# Patient Record
Sex: Male | Born: 1985 | Race: White | Hispanic: No | Marital: Single | Smoking: Current every day smoker
Health system: Southern US, Community
[De-identification: ages and names within clinical notes are randomized; demographics above are authoritative.]

## PROBLEM LIST (undated history)

## (undated) DIAGNOSIS — F419 Anxiety disorder, unspecified: Secondary | ICD-10-CM

## (undated) DIAGNOSIS — R030 Elevated blood-pressure reading, without diagnosis of hypertension: Secondary | ICD-10-CM

## (undated) DIAGNOSIS — B360 Pityriasis versicolor: Secondary | ICD-10-CM

## (undated) DIAGNOSIS — F17200 Nicotine dependence, unspecified, uncomplicated: Secondary | ICD-10-CM

## (undated) HISTORY — PX: TONSILLECTOMY: SUR1361

## (undated) HISTORY — DX: Anxiety disorder, unspecified: F41.9

## (undated) HISTORY — DX: Nicotine dependence, unspecified, uncomplicated: F17.200

## (undated) HISTORY — DX: Pityriasis versicolor: B36.0

## (undated) HISTORY — PX: OTHER SURGICAL HISTORY: SHX169

## (undated) HISTORY — DX: Morbid (severe) obesity due to excess calories: E66.01

## (undated) HISTORY — DX: Elevated blood-pressure reading, without diagnosis of hypertension: R03.0

---

## 2004-06-24 ENCOUNTER — Emergency Department (HOSPITAL_COMMUNITY): Admission: EM | Admit: 2004-06-24 | Discharge: 2004-06-24 | Payer: Self-pay | Admitting: Emergency Medicine

## 2004-07-16 ENCOUNTER — Emergency Department (HOSPITAL_COMMUNITY): Admission: EM | Admit: 2004-07-16 | Discharge: 2004-07-16 | Payer: Self-pay | Admitting: Emergency Medicine

## 2004-08-06 ENCOUNTER — Emergency Department (HOSPITAL_COMMUNITY): Admission: EM | Admit: 2004-08-06 | Discharge: 2004-08-07 | Payer: Self-pay | Admitting: *Deleted

## 2004-08-16 ENCOUNTER — Emergency Department (HOSPITAL_COMMUNITY): Admission: EM | Admit: 2004-08-16 | Discharge: 2004-08-16 | Payer: Self-pay | Admitting: Emergency Medicine

## 2004-11-15 ENCOUNTER — Emergency Department (HOSPITAL_COMMUNITY): Admission: EM | Admit: 2004-11-15 | Discharge: 2004-11-15 | Payer: Self-pay | Admitting: Emergency Medicine

## 2004-12-18 ENCOUNTER — Emergency Department (HOSPITAL_COMMUNITY): Admission: EM | Admit: 2004-12-18 | Discharge: 2004-12-19 | Payer: Self-pay | Admitting: Emergency Medicine

## 2005-03-13 ENCOUNTER — Emergency Department (HOSPITAL_COMMUNITY): Admission: EM | Admit: 2005-03-13 | Discharge: 2005-03-13 | Payer: Self-pay | Admitting: Emergency Medicine

## 2005-03-17 ENCOUNTER — Emergency Department (HOSPITAL_COMMUNITY): Admission: EM | Admit: 2005-03-17 | Discharge: 2005-03-17 | Payer: Self-pay | Admitting: Emergency Medicine

## 2009-08-28 ENCOUNTER — Emergency Department (HOSPITAL_COMMUNITY): Admission: EM | Admit: 2009-08-28 | Discharge: 2009-08-28 | Payer: Self-pay | Admitting: Emergency Medicine

## 2010-06-11 ENCOUNTER — Emergency Department (HOSPITAL_COMMUNITY)
Admission: EM | Admit: 2010-06-11 | Discharge: 2010-06-12 | Payer: Self-pay | Source: Home / Self Care | Admitting: Emergency Medicine

## 2010-07-26 ENCOUNTER — Emergency Department (HOSPITAL_COMMUNITY)
Admission: EM | Admit: 2010-07-26 | Discharge: 2010-07-26 | Payer: Self-pay | Source: Home / Self Care | Admitting: Emergency Medicine

## 2010-09-09 LAB — POCT I-STAT, CHEM 8
BUN: 22 mg/dL (ref 6–23)
Calcium, Ion: 1.15 mmol/L (ref 1.12–1.32)
Chloride: 108 mEq/L (ref 96–112)
Creatinine, Ser: 1.1 mg/dL (ref 0.4–1.5)
Glucose, Bld: 102 mg/dL — ABNORMAL HIGH (ref 70–99)
HCT: 45 % (ref 39.0–52.0)
Hemoglobin: 15.3 g/dL (ref 13.0–17.0)
Potassium: 3.7 mEq/L (ref 3.5–5.1)
Sodium: 141 mEq/L (ref 135–145)
TCO2: 25 mmol/L (ref 0–100)

## 2010-09-09 LAB — URINALYSIS, ROUTINE W REFLEX MICROSCOPIC
Glucose, UA: NEGATIVE mg/dL
Hgb urine dipstick: NEGATIVE
Ketones, ur: NEGATIVE mg/dL
Nitrite: NEGATIVE
Protein, ur: NEGATIVE mg/dL
Specific Gravity, Urine: 1.035 — ABNORMAL HIGH (ref 1.005–1.030)
Urobilinogen, UA: 1 mg/dL (ref 0.0–1.0)
pH: 6 (ref 5.0–8.0)

## 2010-09-22 LAB — POCT I-STAT, CHEM 8
BUN: 11 mg/dL (ref 6–23)
Calcium, Ion: 1.11 mmol/L — ABNORMAL LOW (ref 1.12–1.32)
Chloride: 107 mEq/L (ref 96–112)
Creatinine, Ser: 0.8 mg/dL (ref 0.4–1.5)
Glucose, Bld: 108 mg/dL — ABNORMAL HIGH (ref 70–99)
HCT: 43 % (ref 39.0–52.0)
Hemoglobin: 14.6 g/dL (ref 13.0–17.0)
Potassium: 3.9 mEq/L (ref 3.5–5.1)
Sodium: 139 mEq/L (ref 135–145)
TCO2: 24 mmol/L (ref 0–100)

## 2010-09-22 LAB — CBC
HCT: 41.7 % (ref 39.0–52.0)
Hemoglobin: 14.3 g/dL (ref 13.0–17.0)
MCHC: 34.4 g/dL (ref 30.0–36.0)
MCV: 83.2 fL (ref 78.0–100.0)
Platelets: 155 10*3/uL (ref 150–400)
RBC: 5.01 MIL/uL (ref 4.22–5.81)
RDW: 12.3 % (ref 11.5–15.5)
WBC: 6.9 10*3/uL (ref 4.0–10.5)

## 2010-09-22 LAB — DIFFERENTIAL
Basophils Absolute: 0 10*3/uL (ref 0.0–0.1)
Basophils Relative: 0 % (ref 0–1)
Eosinophils Absolute: 0.1 10*3/uL (ref 0.0–0.7)
Eosinophils Relative: 1 % (ref 0–5)
Lymphocytes Relative: 6 % — ABNORMAL LOW (ref 12–46)
Lymphs Abs: 0.4 10*3/uL — ABNORMAL LOW (ref 0.7–4.0)
Monocytes Absolute: 0.6 10*3/uL (ref 0.1–1.0)
Monocytes Relative: 8 % (ref 3–12)
Neutro Abs: 5.8 10*3/uL (ref 1.7–7.7)
Neutrophils Relative %: 85 % — ABNORMAL HIGH (ref 43–77)

## 2010-09-22 LAB — RAPID STREP SCREEN (MED CTR MEBANE ONLY): Streptococcus, Group A Screen (Direct): NEGATIVE

## 2011-03-19 ENCOUNTER — Emergency Department (HOSPITAL_COMMUNITY): Payer: Self-pay

## 2011-03-19 ENCOUNTER — Emergency Department (HOSPITAL_COMMUNITY)
Admission: EM | Admit: 2011-03-19 | Discharge: 2011-03-20 | Disposition: A | Discharge status: Home / Self Care | Payer: Self-pay | Attending: Emergency Medicine | Admitting: Emergency Medicine

## 2011-03-19 DIAGNOSIS — R209 Unspecified disturbances of skin sensation: Secondary | ICD-10-CM | POA: Insufficient documentation

## 2011-03-19 DIAGNOSIS — I10 Essential (primary) hypertension: Secondary | ICD-10-CM | POA: Insufficient documentation

## 2011-03-19 DIAGNOSIS — W298XXA Contact with other powered powered hand tools and household machinery, initial encounter: Secondary | ICD-10-CM | POA: Insufficient documentation

## 2011-03-19 DIAGNOSIS — S61409A Unspecified open wound of unspecified hand, initial encounter: Secondary | ICD-10-CM | POA: Insufficient documentation

## 2011-08-07 ENCOUNTER — Encounter (HOSPITAL_COMMUNITY): Payer: Self-pay | Admitting: *Deleted

## 2011-08-07 ENCOUNTER — Emergency Department (HOSPITAL_COMMUNITY)
Admission: EM | Admit: 2011-08-07 | Discharge: 2011-08-07 | Disposition: A | Discharge status: Home / Self Care | Payer: Self-pay | Attending: Emergency Medicine | Admitting: Emergency Medicine

## 2011-08-07 DIAGNOSIS — B86 Scabies: Secondary | ICD-10-CM | POA: Insufficient documentation

## 2011-08-07 MED ORDER — PERMETHRIN 5 % EX CREA: TOPICAL_CREAM | CUTANEOUS | Status: AC

## 2011-08-07 NOTE — ED Notes (Signed)
Pt in stating he has been exposed to scabies, c/o generalized rash, bumps noted in webbing of fingers, pt states rash x1 month

## 2011-08-08 NOTE — ED Provider Notes (Signed)
History     CSN: [id number]  Arrival date & time 08/07/11  1701   None     Chief Complaint  Patient presents with  . Rash    (Consider location/radiation/quality/duration/timing/severity/associated sxs/prior treatment) HPI  Patient presents to emergency department complaining of a history of scabies exposure as well as a couple day history of a few scattered insect appearing bites on his hands. Patient states one of his family members was diagnosed with scabies and since then states multiple family members have had onset of insect bite marks on body. Patient states the lesions are extremely pruritic. Patient not taking treatment prior to arrival. Patient says he has no known medical problems and takes no medicine on regular basis. Symptoms were gradual onset, and unchanging. Denies aggravating or alleviating factors.  History reviewed. No pertinent past medical history.  History reviewed. No pertinent past surgical history.  History reviewed. No pertinent family history.  History  Substance Use Topics  . Smoking status: Not on file  . Smokeless tobacco: Not on file  . Alcohol Use: Not on file      Review of Systems  All other systems reviewed and are negative.    Allergies  Review of patient's allergies indicates no known allergies.  Home Medications   Current Outpatient Rx  Name Route Sig Dispense Refill  . HYDROCHLOROTHIAZIDE 25 MG PO TABS Oral Take 25 mg by mouth daily.    Marland Kitchen PERMETHRIN 5 % EX CREA  Apply from the neck down and sleep in for 8 hours then wash off in the morning. Reapply the same two weeks later. 60 g 1    BP 141/80  Pulse 80  Temp(Src) 98 F (36.7 C) (Oral)  Resp 20  SpO2 100%  Physical Exam  Vitals reviewed. Constitutional: He is oriented to person, place, and time. He appears well-developed and well-nourished. No distress.  HENT:  Head: Normocephalic and atraumatic.  Mouth/Throat: No oropharyngeal exudate.       Mild erythema of  posterior pharynx and tonsils no tonsillar exudate or enlargement. Patent airway. Swallowing secretions well  Eyes: Conjunctivae are normal.  Neck: Normal range of motion. Neck supple.  Cardiovascular: Normal rate, regular rhythm and normal heart sounds.  Exam reveals no gallop and no friction rub.   No murmur heard. Pulmonary/Chest: Effort normal and breath sounds normal. No respiratory distress. He has no wheezes. He has no rales. He exhibits no tenderness.  Abdominal: Soft. He exhibits no distension. There is no tenderness.  Neurological: He is alert and oriented to person, place, and time. He has normal reflexes.  Skin: Skin is warm and dry. Rash noted. He is not diaphoretic.       A few scattered pinpoint erythematous papules of interdigital spaces of bilateral hands  Psychiatric: He has a normal mood and affect.    ED Course  Procedures (including critical care time)  Labs Reviewed - No data to display No results found.   1. Scabies       MDM  No signs or symptoms of allergic reaction or superimposed infection.         Lenon Oms [place], [place] 08/08/11 [phone]

## 2011-08-08 NOTE — ED Provider Notes (Signed)
Medical screening examination/treatment/procedure(s) were performed by non-physician practitioner and as supervising physician I was immediately available for consultation/collaboration.   Zea Kostka A. Patrica Duel, MD 08/08/11 [phone]

## 2012-05-04 ENCOUNTER — Emergency Department (HOSPITAL_COMMUNITY): Payer: Self-pay

## 2012-05-04 ENCOUNTER — Encounter (HOSPITAL_COMMUNITY): Payer: Self-pay | Admitting: Emergency Medicine

## 2012-05-04 ENCOUNTER — Emergency Department (HOSPITAL_COMMUNITY)
Admission: EM | Admit: 2012-05-04 | Discharge: 2012-05-04 | Disposition: A | Discharge status: Home / Self Care | Payer: Self-pay | Attending: Emergency Medicine | Admitting: Emergency Medicine

## 2012-05-04 DIAGNOSIS — B354 Tinea corporis: Secondary | ICD-10-CM | POA: Insufficient documentation

## 2012-05-04 DIAGNOSIS — R55 Syncope and collapse: Secondary | ICD-10-CM | POA: Insufficient documentation

## 2012-05-04 DIAGNOSIS — F17200 Nicotine dependence, unspecified, uncomplicated: Secondary | ICD-10-CM | POA: Insufficient documentation

## 2012-05-04 LAB — COMPREHENSIVE METABOLIC PANEL
ALT: 17 U/L (ref 0–53)
AST: 18 U/L (ref 0–37)
Albumin: 4.2 g/dL (ref 3.5–5.2)
Alkaline Phosphatase: 64 U/L (ref 39–117)
BUN: 14 mg/dL (ref 6–23)
CO2: 21 mEq/L (ref 19–32)
Calcium: 9.3 mg/dL (ref 8.4–10.5)
Creatinine, Ser: 0.92 mg/dL (ref 0.50–1.35)
GFR calc non Af Amer: >90 mL/min (ref >90)
Glucose, Bld: 78 mg/dL (ref 70–99)
Potassium: 4.3 mEq/L (ref 3.5–5.1)
Sodium: 138 mEq/L (ref 135–145)
Total Protein: 7.5 g/dL (ref 6.0–8.3)

## 2012-05-04 LAB — GLUCOSE, CAPILLARY: Glucose-Capillary: 103 mg/dL — ABNORMAL HIGH (ref 70–99)

## 2012-05-04 LAB — CBC WITH DIFFERENTIAL/PLATELET
Basophils Absolute: 0 10*3/uL (ref 0.0–0.1)
Basophils Relative: 0 % (ref 0–1)
Eosinophils Relative: 2 % (ref 0–5)
HCT: 48 % (ref 39.0–52.0)
Lymphocytes Relative: 24 % (ref 12–46)
Lymphs Abs: 2.6 10*3/uL (ref 0.7–4.0)
MCH: 28.3 pg (ref 26.0–34.0)
MCHC: 34.4 g/dL (ref 30.0–36.0)
MCV: 82.3 fL (ref 78.0–100.0)
Monocytes Relative: 8 % (ref 3–12)
Neutro Abs: 6.9 10*3/uL (ref 1.7–7.7)
Neutrophils Relative %: 65 % (ref 43–77)
Platelets: 284 10*3/uL (ref 150–400)
RDW: 12.4 % (ref 11.5–15.5)
WBC: 10.6 10*3/uL — ABNORMAL HIGH (ref 4.0–10.5)

## 2012-05-04 MED ORDER — FLUCONAZOLE 200 MG PO TABS: 200.0000 mg | ORAL_TABLET | ORAL | Status: AC

## 2012-05-04 NOTE — ED Provider Notes (Signed)
History     CSN: [id number]  Arrival date & time 05/04/12  1851   First MD Initiated Contact with Patient 05/04/12 1937      Chief Complaint  Patient presents with  . Dizziness    HPI  The patient presents after an episode of lightheadedness.  He notes significant anxiety over this episode, which occurred just prior to arrival.  Recalls feeling particularly lightheaded, though without syncope, vomiting, confusion, disorientation, visual changes.  On exam the patient has no ongoing complaints, beyond ongoing skin lesions.  He states over the past years he has had similar episodes of lightheadedness, though also with no syncope.  No prior medical evaluation for these episodes.  He denies any exertional syncope, any chest pain or dyspnea throughout all of these episodes.  History reviewed. No pertinent past medical history.  Past Surgical History  Procedure Date  . Hernia repair   . Tonsillectomy     No family history on file.  History  Substance Use Topics  . Smoking status: Current Every Day Smoker -- 1.0 packs/day  . Smokeless tobacco: Not on file  . Alcohol Use: Yes      Review of Systems  Constitutional:       Per HPI, otherwise negative  HENT:       Per HPI, otherwise negative  Eyes: Negative.   Respiratory:       Per HPI, otherwise negative  Cardiovascular:       Per HPI, otherwise negative  Gastrointestinal: Negative for vomiting.  Genitourinary: Negative.   Musculoskeletal:       Per HPI, otherwise negative  Skin: Positive for rash.  Neurological: Positive for dizziness and light-headedness. Negative for tremors, seizures, syncope, facial asymmetry, speech difficulty, weakness, numbness and headaches.    Allergies  Review of patient's allergies indicates no known allergies.  Home Medications   Current Outpatient Rx  Name  Route  Sig  Dispense  Refill  . IBUPROFEN 200 MG PO TABS   Oral   Take 200 mg by mouth every 6 (six) hours as needed. headache           BP 138/80  Pulse 103  Temp 98.1 F (36.7 C) (Oral)  Resp 23  SpO2 99%  Physical Exam  Nursing note and vitals reviewed. Constitutional: He is oriented to person, place, and time. He appears well-developed. No distress.  HENT:  Head: Normocephalic and atraumatic.  Eyes: Conjunctivae normal and EOM are normal.  Cardiovascular: Normal rate and regular rhythm.   Pulmonary/Chest: Effort normal. No stridor. No respiratory distress.  Abdominal: He exhibits no distension.  Musculoskeletal: He exhibits no edema.       No asym of le, no ttp, no superficial changes  Neurological: He is alert and oriented to person, place, and time.  Skin: Skin is warm and dry. Rash noted.       Diffuse rash about her torso consistent with tinea  Psychiatric: He has a normal mood and affect.    ED Course  Procedures (including critical care time)  Labs Reviewed  GLUCOSE, CAPILLARY - Abnormal; Notable for the following:    Glucose-Capillary 103 (*)     All other components within normal limits  CBC WITH DIFFERENTIAL  COMPREHENSIVE METABOLIC PANEL   Dg Chest 2 View  05/04/2012  *RADIOLOGY REPORT*  Clinical Data: Chest discomfort  CHEST - 2 VIEW  Comparison: 08/28/2009; 06/24/2004  Findings: Grossly unchanged cardiac silhouette and mediastinal contours.  No focal airspace opacities.  No pleural  effusion or pneumothorax.  Unchanged bones.  IMPRESSION: No acute cardiopulmonary disease.   Original Report Authenticated By: Tacey Ruiz, MD      No diagnosis found.   Date: 05/04/2012  Rate: 74  Rhythm: normal sinus rhythm  QRS Axis: normal  Intervals: normal  ST/T Wave abnormalities: normal  Conduction Disutrbances:rsr  Narrative Interpretation:   Old EKG Reviewed: unchanged IVCD is new compared to old. - ABNORMAL  Cardiac: 85sr, normal  O2- 99%ra, normal  Update, the patient requests discharge.  He continues to deny any ongoing complaints    MDM  This young male presents of an  episode of lightheadedness, with no syncope, vomiting, confusion or disorientation.  The patient's description of multiple prior similar events over the past few years is suggestive of vagal mediated, though there is some consideration of dysrhythmia.  The patient's ECG is unremarkable, as are his labs.  There is some consideration of thromboembolic phenomena,, but absent tachycardia, tachypnea, unilateral leg changes, there is low suspicion of DVT/PE.  The patient requested PO meds for his ongoing tinea, which was not responding to topical therapy.  This was accommodated.    Although the patient's w/u was largely reassuring, I stressed the importance of continued evaluation via PMD for complete w/u.     Gerhard Munch, MD 05/04/12 2318

## 2012-05-04 NOTE — ED Notes (Signed)
Pt states that he sometimes gets dizzy.  States that he is "scared shitless" because he was going to sit down earlier today and he got dizzy and tired.  Denies LOC.  All neuro intact.  Pt A&O x4.

## 2013-05-25 IMAGING — CR DG HAND COMPLETE 3+V*L*
3 series · 3 of 3 positions shown · non-contrast
Comparison: None

CLINICAL DATA: Left hand injury and pain.

LEFT HAND - COMPLETE 3+ VIEW

[x hand pa left (1 of 3)]
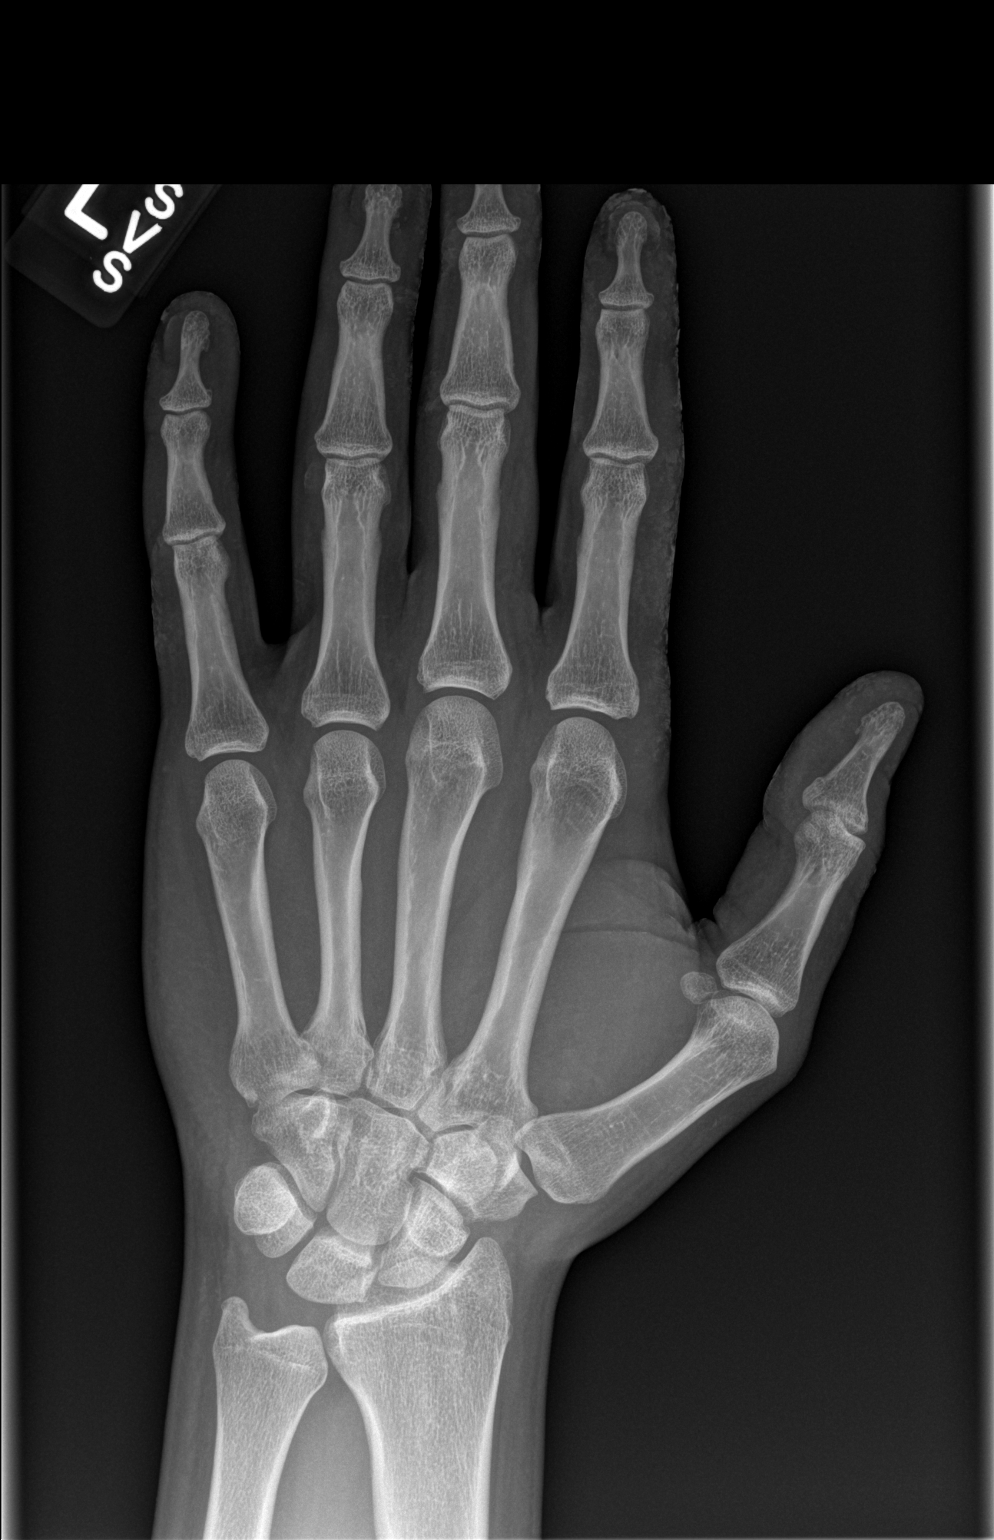

[x hand pa left (2 of 3)]
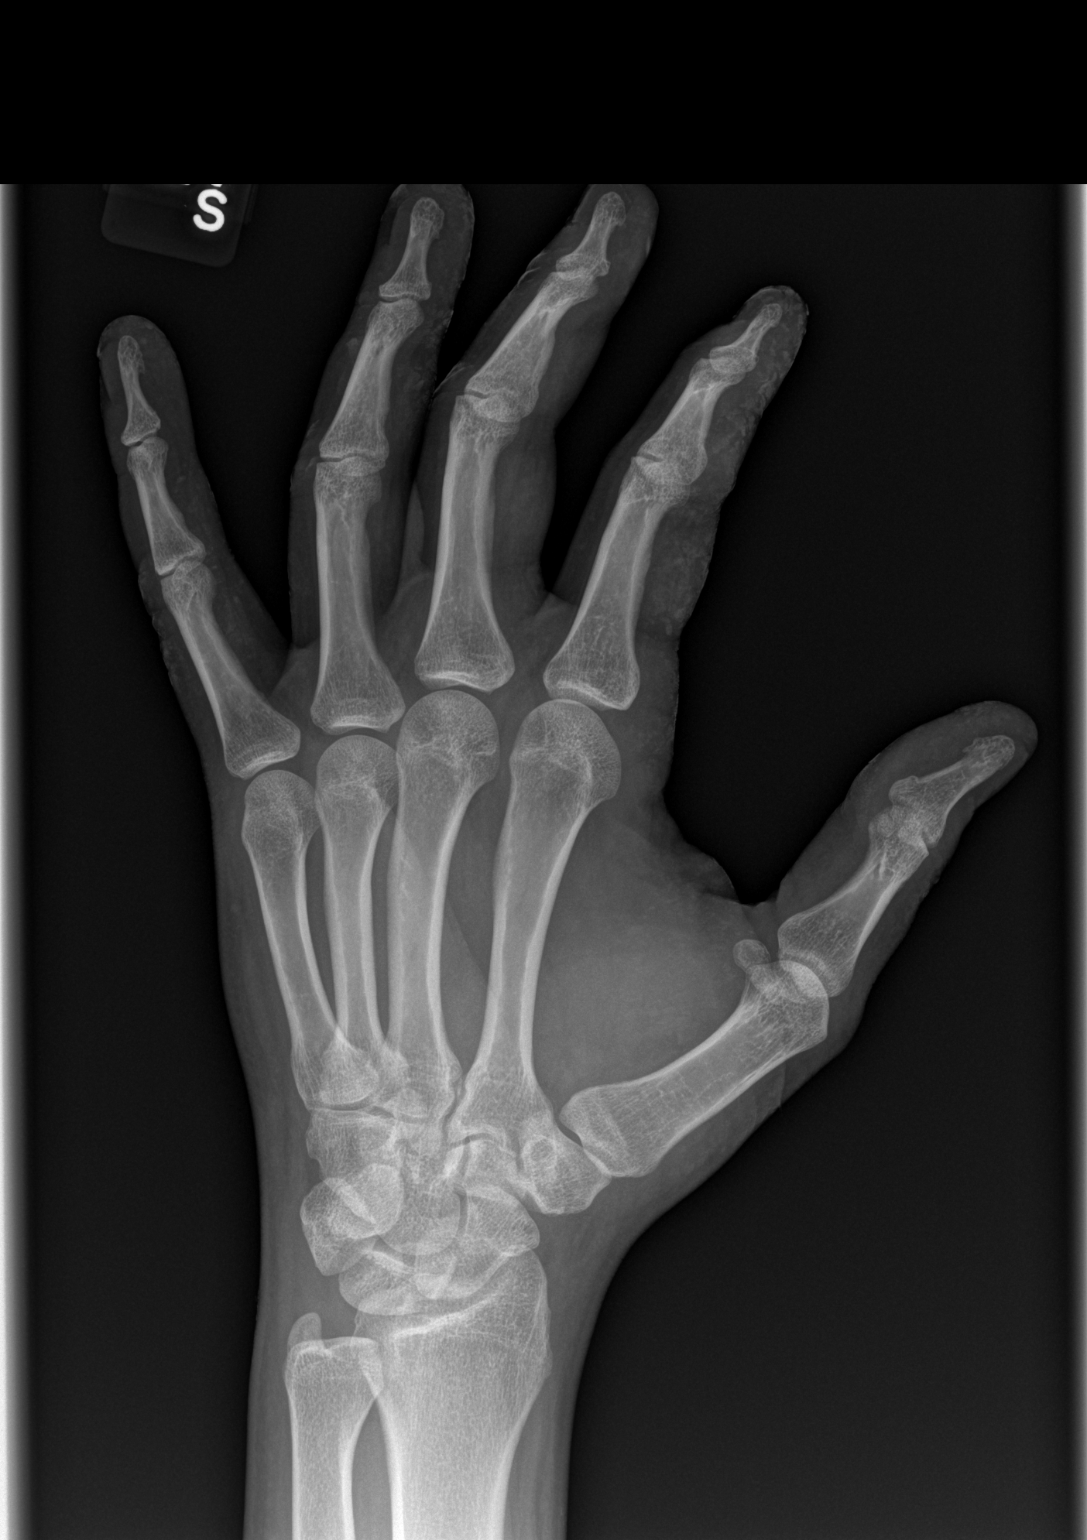

[x hand pa left (3 of 3)]
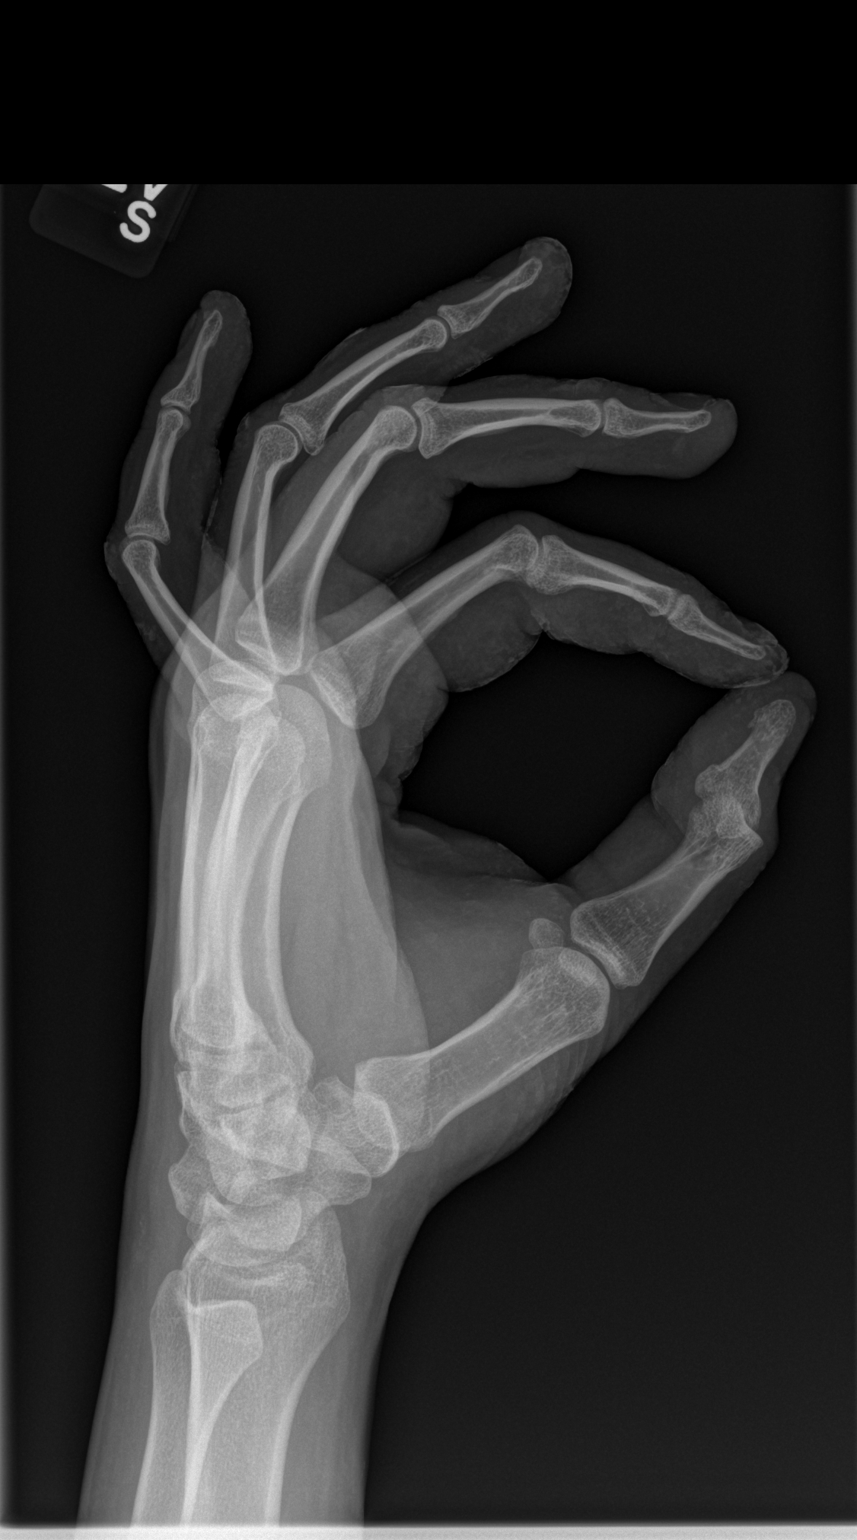

[3 of 3 positions shown; findings below may reference images not displayed]

FINDINGS: No evidence of acute fracture, subluxation or dislocation
identified.

No radio-opaque foreign bodies are present.

No focal bony lesions are noted.

The joint spaces are unremarkable.
IMPRESSION: No acute bony abnormalities.

## 2013-07-02 ENCOUNTER — Encounter (HOSPITAL_COMMUNITY): Payer: Self-pay | Admitting: Emergency Medicine

## 2013-07-02 ENCOUNTER — Emergency Department (HOSPITAL_COMMUNITY)
Admission: EM | Admit: 2013-07-02 | Discharge: 2013-07-02 | Disposition: A | Discharge status: Home / Self Care | Payer: Self-pay | Attending: Emergency Medicine | Admitting: Emergency Medicine

## 2013-07-02 DIAGNOSIS — F17200 Nicotine dependence, unspecified, uncomplicated: Secondary | ICD-10-CM | POA: Insufficient documentation

## 2013-07-02 DIAGNOSIS — Y929 Unspecified place or not applicable: Secondary | ICD-10-CM | POA: Insufficient documentation

## 2013-07-02 DIAGNOSIS — W540XXA Bitten by dog, initial encounter: Secondary | ICD-10-CM | POA: Insufficient documentation

## 2013-07-02 DIAGNOSIS — S61452A Open bite of left hand, initial encounter: Secondary | ICD-10-CM

## 2013-07-02 DIAGNOSIS — Y9389 Activity, other specified: Secondary | ICD-10-CM | POA: Insufficient documentation

## 2013-07-02 DIAGNOSIS — S61409A Unspecified open wound of unspecified hand, initial encounter: Secondary | ICD-10-CM | POA: Insufficient documentation

## 2013-07-02 MED ORDER — IBUPROFEN 800 MG PO TABS
800.0000 mg | ORAL_TABLET | Freq: Once | ORAL | Status: AC
  Administered 2013-07-02: 800 mg via ORAL
  Filled 2013-07-02: qty 1

## 2013-07-02 MED ORDER — IBUPROFEN 800 MG PO TABS: 800.0000 mg | ORAL_TABLET | Freq: Three times a day (TID) | ORAL | Status: DC

## 2013-07-02 MED ORDER — AMOXICILLIN-POT CLAVULANATE 875-125 MG PO TABS
1.0000 | ORAL_TABLET | Freq: Once | ORAL | Status: AC
  Administered 2013-07-02: 1 via ORAL
  Filled 2013-07-02: qty 1

## 2013-07-02 MED ORDER — AMOXICILLIN-POT CLAVULANATE 875-125 MG PO TABS: 1.0000 | ORAL_TABLET | Freq: Two times a day (BID) | ORAL | Status: DC

## 2013-07-02 NOTE — ED Provider Notes (Signed)
CSN: [id number]     Arrival date & time 07/02/13  1507 History  This chart was scribed for non-physician practitioner Elpidio Anis working with Layla Maw Ward, DO by Elveria Rising, ED Scribe. This patient was seen in room WTR8/WTR8 and the patient's care was started at 4:35 PM.   Chief Complaint  Patient presents with  . Animal Bite    The history is provided by the patient. No language interpreter was used.   HPI Comments: Christopher Le is a 28 y.o. male who presents to the Emergency Department complaining of pit bull bite to left hand earlier this afternoon. Patient states that he killed the dog and placed body in dumpster, but says that he is "100% confident the dog did not have rabies."  Patient presents with lacerations and swelling to left hand. Patient says his tetanus vaccination is UTD. Reports no known allergies.   History reviewed. No pertinent past medical history. Past Surgical History  Procedure Laterality Date  . Hernia repair    . Tonsillectomy     History reviewed. No pertinent family history. History  Substance Use Topics  . Smoking status: Current Every Day Smoker -- 1.00 packs/day  . Smokeless tobacco: Not on file  . Alcohol Use: Yes    Review of Systems  Skin: Positive for wound (Dog bite to left hand.).  All other systems reviewed and are negative.    Allergies  Review of patient's allergies indicates no known allergies.  Home Medications  No current outpatient prescriptions on file. Triage Vitals: BP 128/112  Pulse 88  Temp(Src) 98.2 F (36.8 C) (Oral)  Resp 18  SpO2 98% Physical Exam  Nursing note and vitals reviewed. Constitutional: He is oriented to person, place, and time. He appears well-developed and well-nourished. No distress.  HENT:  Head: Normocephalic and atraumatic.  Eyes: EOM are normal.  Neck: Neck supple. No tracheal deviation present.  Pulmonary/Chest: Effort normal. No respiratory distress.  Musculoskeletal:  Left hand has  multiple laceration: One to dorsum overlying 4th MC. Full thickness wound measuring approximately 1.5 cm. Second laceration to interphalangeal space between 4th and 5th digit also full thickness wound measuring approximately 1 cm. Deep abrasion to proximal 4th digit.  No bony deformities. Full ROM.  No tendon deficits.   Neurological: He is alert and oriented to person, place, and time.  Skin: Skin is warm and dry.  Psychiatric: He has a normal mood and affect. His behavior is normal.    ED Course  Procedures (including critical care time) DIAGNOSTIC STUDIES: Oxygen Saturation is 98% on room air, normal by my interpretation.    COORDINATION OF CARE: 4:38 PM- Pt advised of plan for treatment and pt agrees.    Labs Review Labs Reviewed - No data to display Imaging Review No results found.  EKG Interpretation   None       MDM  No diagnosis found. 1. Animal bite, hand  Discussed need for rabies series. The patient declines saying that "I am 100% sure the dog was up-to-date on rabies shots". When asked where the dog that he killed after biting him was he stated it was "in a dumpster". He was informed animal control would be notified for follow up. Discussed the danger of untreated exposure to rabies and that rabies was 100% fatal if contracted and untreated. He declines vaccination. Wounds cleaned, augmentin started.  I personally performed the services described in this documentation, which was scribed in my presence. The recorded information has been reviewed  and is accurate.     Arnoldo HookerShari A Twylah Bennetts, PA-C 07/03/13 1703

## 2013-07-02 NOTE — Discharge Instructions (Signed)
IT IS VERY IMPORTANT THAT YOU TAKE THE ANTIBIOTIC TO PREVENT A LIKELY INFECTION IN THE HAND AFTER THE DOG BITE. RETURN HERE WITH ANY SIGNIFICANT SWELLING, REDNESS, INCREASING PAIN OR DRAINAGE FOR THE BITE WOUNDS.

## 2013-07-02 NOTE — ED Notes (Signed)
Pt states that he knew the dog, belonged to friends and that he was current on shots
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
## 2013-07-13 NOTE — ED Provider Notes (Signed)
Medical screening examination/treatment/procedure(s) were performed by non-physician practitioner and as supervising physician I was immediately available for consultation/collaboration.  EKG Interpretation   None         Jacoba Cherney, MD 07/13/13 0020 

## 2014-01-16 ENCOUNTER — Emergency Department (HOSPITAL_COMMUNITY)
Admission: EM | Admit: 2014-01-16 | Discharge: 2014-01-16 | Disposition: A | Discharge status: Home / Self Care | Payer: Self-pay | Attending: Emergency Medicine | Admitting: Emergency Medicine

## 2014-01-16 ENCOUNTER — Encounter (HOSPITAL_COMMUNITY): Payer: Self-pay | Admitting: Emergency Medicine

## 2014-01-16 DIAGNOSIS — Y929 Unspecified place or not applicable: Secondary | ICD-10-CM | POA: Insufficient documentation

## 2014-01-16 DIAGNOSIS — Y9389 Activity, other specified: Secondary | ICD-10-CM | POA: Insufficient documentation

## 2014-01-16 DIAGNOSIS — X131XXA Other contact with steam and other hot vapors, initial encounter: Secondary | ICD-10-CM

## 2014-01-16 DIAGNOSIS — L02419 Cutaneous abscess of limb, unspecified: Secondary | ICD-10-CM | POA: Insufficient documentation

## 2014-01-16 DIAGNOSIS — F17200 Nicotine dependence, unspecified, uncomplicated: Secondary | ICD-10-CM | POA: Insufficient documentation

## 2014-01-16 DIAGNOSIS — Z79899 Other long term (current) drug therapy: Secondary | ICD-10-CM | POA: Insufficient documentation

## 2014-01-16 DIAGNOSIS — X12XXXA Contact with other hot fluids, initial encounter: Secondary | ICD-10-CM | POA: Insufficient documentation

## 2014-01-16 DIAGNOSIS — T300 Burn of unspecified body region, unspecified degree: Secondary | ICD-10-CM

## 2014-01-16 DIAGNOSIS — L03119 Cellulitis of unspecified part of limb: Principal | ICD-10-CM

## 2014-01-16 DIAGNOSIS — L03116 Cellulitis of left lower limb: Secondary | ICD-10-CM

## 2014-01-16 MED ORDER — IBUPROFEN 800 MG PO TABS
800.0000 mg | ORAL_TABLET | Freq: Once | ORAL | Status: AC
  Administered 2014-01-16: 800 mg via ORAL
  Filled 2014-01-16: qty 1

## 2014-01-16 MED ORDER — SULFAMETHOXAZOLE-TMP DS 800-160 MG PO TABS
1.0000 | ORAL_TABLET | Freq: Once | ORAL | Status: AC
  Administered 2014-01-16: 1 via ORAL
  Filled 2014-01-16: qty 1

## 2014-01-16 MED ORDER — CEPHALEXIN 250 MG PO CAPS
250.0000 mg | ORAL_CAPSULE | Freq: Once | ORAL | Status: AC
  Administered 2014-01-16: 250 mg via ORAL
  Filled 2014-01-16: qty 1

## 2014-01-16 MED ORDER — SULFAMETHOXAZOLE-TRIMETHOPRIM 800-160 MG PO TABS: 1.0000 | ORAL_TABLET | Freq: Two times a day (BID) | ORAL | Status: DC

## 2014-01-16 MED ORDER — OXYCODONE-ACETAMINOPHEN 5-325 MG PO TABS: 1.0000 | ORAL_TABLET | Freq: Four times a day (QID) | ORAL | Status: DC | PRN

## 2014-01-16 MED ORDER — SILVER SULFADIAZINE 1 % EX CREA
TOPICAL_CREAM | Freq: Once | CUTANEOUS | Status: AC
Start: 2014-01-16 — End: 2014-01-16
  Administered 2014-01-16: 03:00:00 via TOPICAL
  Filled 2014-01-16: qty 50

## 2014-01-16 MED ORDER — CEPHALEXIN 500 MG PO CAPS: 500.0000 mg | ORAL_CAPSULE | Freq: Four times a day (QID) | ORAL | Status: DC
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
## 2014-01-19 NOTE — ED Provider Notes (Signed)
Medical screening examination/treatment/procedure(s) were performed by non-physician practitioner and as supervising physician I was immediately available for consultation/collaboration.   Candyce ChurnJohn David Zechariah Bissonnette III, MD 01/19/14 0900

## 2014-10-19 ENCOUNTER — Telehealth: Payer: Self-pay | Admitting: Internal Medicine

## 2014-10-19 NOTE — Telephone Encounter (Signed)
Patient states his father is a patient of Dr. Raphael GibneyJohn's.  He has not been feeling well and would like to establish care with Dr. Alondra RuizJohn.  Please advise.

## 2014-10-24 NOTE — Telephone Encounter (Signed)
Got patient scheduled

## 2014-10-24 NOTE — Telephone Encounter (Signed)
Ok with me 

## 2014-10-26 ENCOUNTER — Encounter: Payer: Self-pay | Admitting: Internal Medicine

## 2014-10-26 ENCOUNTER — Ambulatory Visit (INDEPENDENT_AMBULATORY_CARE_PROVIDER_SITE_OTHER): Payer: Self-pay | Admitting: Internal Medicine

## 2014-10-26 VITALS — BP 122/82 | HR 84 | Temp 98.9°F | Resp 18 | Ht 75.0 in | Wt 299.1 lb

## 2014-10-26 DIAGNOSIS — Z0000 Encounter for general adult medical examination without abnormal findings: Secondary | ICD-10-CM

## 2014-10-26 DIAGNOSIS — Z720 Tobacco use: Secondary | ICD-10-CM

## 2014-10-26 DIAGNOSIS — B360 Pityriasis versicolor: Secondary | ICD-10-CM | POA: Insufficient documentation

## 2014-10-26 DIAGNOSIS — I10 Essential (primary) hypertension: Secondary | ICD-10-CM | POA: Insufficient documentation

## 2014-10-26 DIAGNOSIS — R030 Elevated blood-pressure reading, without diagnosis of hypertension: Secondary | ICD-10-CM

## 2014-10-26 DIAGNOSIS — F17200 Nicotine dependence, unspecified, uncomplicated: Secondary | ICD-10-CM | POA: Insufficient documentation

## 2014-10-26 LAB — GLUCOSE, POCT (MANUAL RESULT ENTRY): POC GLUCOSE: 106 mg/dL — AB (ref 70–99)

## 2014-10-26 MED ORDER — NALTREXONE-BUPROPION HCL ER 8-90 MG PO TB12: ORAL_TABLET | ORAL | Status: AC

## 2014-10-26 MED ORDER — NALTREXONE-BUPROPION HCL ER 8-90 MG PO TB12: ORAL_TABLET | ORAL | Status: DC

## 2014-10-26 NOTE — Assessment & Plan Note (Signed)
Urged to quit 

## 2014-10-26 NOTE — Assessment & Plan Note (Signed)
Ok for [employer],  to f/u any worsening symptoms or concerns

## 2014-10-26 NOTE — Addendum Note (Signed)
Addended by: Maurene CapesPOTTS, Kiylah Loyer M on: 10/26/2014 04:59 PM   Modules accepted: Orders

## 2014-10-26 NOTE — Patient Instructions (Signed)
Please take all new medication as prescribed- the contrave  Please continue all other medications as before, and refills have been done if requested.  Please have the pharmacy call with any other refills you may need.  Please continue your efforts at being more active, low cholesterol diet, and weight control.  Please keep your appointments with your specialists as you may have planned  [facility]

## 2014-10-26 NOTE — Progress Notes (Signed)
   Subjective:    Patient ID: Christopher Le, male    DOB: 10/08/1985, 29 y.o.   MRN: [id number]  HPI  Here to establish, asking for contrave which has worked for him with wt loss in the past, trying to get from his 300 to 220,;  Overall doing ok;  Pt denies Chest pain, worsening SOB, DOE, wheezing, orthopnea, PND, worsening LE edema, palpitations, dizziness or syncope.  Pt denies neurological change such as new headache, facial or extremity weakness.  Pt denies polydipsia, polyuria, or low sugar symptoms. Pt states overall good compliance with treatment and medications, good tolerability, and has been trying to follow appropriate diet.  Pt denies worsening depressive symptoms, suicidal ideation or panic. No fever, night sweats, wt loss, loss of appetite, or other constitutional symptoms. No significant exercise recently.  Still smoking Past Medical History  Diagnosis Date  . Elevated blood pressure reading without diagnosis of hypertension   . Anxiety    Past Surgical History  Procedure Laterality Date  . Bilat inguinal hernia    . Tonsillectomy      reports that he has been smoking.  He does not have any smokeless tobacco history on file. He reports that he drinks alcohol. He reports that he does not use illicit drugs. family history includes Heart attack in his father. No Known Allergies  Review of Systems All otherwise neg per pt     Objective:   Physical Exam BP 122/82 mmHg  Pulse 84  Temp(Src) 98.9 F (37.2 C) (Oral)  Resp 18  Ht 6\' 3"  (1.905 m)  Wt 299 lb 1.3 oz (135.662 kg)  BMI 37.38 kg/m2  SpO2 95% VS noted, morbid obese Constitutional: Pt appears in no significant distress HENT: Head: NCAT.  Right Ear: External ear normal.  Left Ear: External ear normal.  Eyes: . Pupils are equal, round, and reactive to light. Conjunctivae and EOM are normal Neck: Normal range of motion. Neck supple.  Cardiovascular: Normal rate and regular rhythm.   Pulmonary/Chest: Effort normal  and breath sounds without rales or wheezing.  Abd:  Soft, NT, ND, + BS Neurological: Pt is alert. Not confused , motor grossly intact Skin: Skin is warm. No rash, no LE edema Psychiatric: Pt behavior is normal. No agitation. 2+ nervous    Assessment & Plan:

## 2014-10-26 NOTE — Assessment & Plan Note (Signed)
stable overall by history and exam, recent data reviewed with pt, and pt to continue medical treatment as before,  to f/u any worsening symptoms or concerns BP Readings from Last 3 Encounters:  10/26/14 122/82  01/16/14 132/70  07/02/13 128/112

## 2014-12-09 ENCOUNTER — Emergency Department (HOSPITAL_BASED_OUTPATIENT_CLINIC_OR_DEPARTMENT_OTHER)
Admission: EM | Admit: 2014-12-09 | Discharge: 2014-12-09 | Disposition: A | Discharge status: Home / Self Care | Payer: Self-pay | Attending: Emergency Medicine | Admitting: Emergency Medicine

## 2014-12-09 ENCOUNTER — Encounter (HOSPITAL_BASED_OUTPATIENT_CLINIC_OR_DEPARTMENT_OTHER): Payer: Self-pay

## 2014-12-09 DIAGNOSIS — Z79899 Other long term (current) drug therapy: Secondary | ICD-10-CM | POA: Insufficient documentation

## 2014-12-09 DIAGNOSIS — Z792 Long term (current) use of antibiotics: Secondary | ICD-10-CM | POA: Insufficient documentation

## 2014-12-09 DIAGNOSIS — Z8619 Personal history of other infectious and parasitic diseases: Secondary | ICD-10-CM | POA: Insufficient documentation

## 2014-12-09 DIAGNOSIS — L02511 Cutaneous abscess of right hand: Secondary | ICD-10-CM | POA: Insufficient documentation

## 2014-12-09 DIAGNOSIS — Z8659 Personal history of other mental and behavioral disorders: Secondary | ICD-10-CM | POA: Insufficient documentation

## 2014-12-09 DIAGNOSIS — Z720 Tobacco use: Secondary | ICD-10-CM | POA: Insufficient documentation

## 2014-12-09 MED ORDER — AMOXICILLIN-POT CLAVULANATE 875-125 MG PO TABS
ORAL_TABLET | ORAL | Status: AC
  Filled 2014-12-09: qty 1

## 2014-12-09 MED ORDER — HYDROCODONE-ACETAMINOPHEN 5-325 MG PO TABS: 1.0000 | ORAL_TABLET | Freq: Once | ORAL | Status: DC

## 2014-12-09 MED ORDER — AMOXICILLIN-POT CLAVULANATE 875-125 MG PO TABS: 1.0000 | ORAL_TABLET | Freq: Two times a day (BID) | ORAL | Status: DC

## 2014-12-09 MED ORDER — AMOXICILLIN-POT CLAVULANATE 875-125 MG PO TABS
1.0000 | ORAL_TABLET | Freq: Once | ORAL | Status: AC
  Administered 2014-12-09: 1 via ORAL

## 2014-12-09 MED ORDER — HYDROCODONE-ACETAMINOPHEN 5-325 MG PO TABS
ORAL_TABLET | ORAL | Status: AC
  Filled 2014-12-09: qty 1

## 2014-12-09 MED ORDER — LIDOCAINE HCL (PF) 1 % IJ SOLN
INTRAMUSCULAR | Status: AC
  Administered 2014-12-09: 02:00:00
  Filled 2014-12-09: qty 5
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
## 2014-12-09 NOTE — Discharge Instructions (Signed)
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
## 2016-01-16 ENCOUNTER — Emergency Department (HOSPITAL_COMMUNITY)
Admission: EM | Admit: 2016-01-16 | Discharge: 2016-01-16 | Disposition: A | Discharge status: Home / Self Care | Payer: Self-pay | Attending: Emergency Medicine | Admitting: Emergency Medicine

## 2016-01-16 ENCOUNTER — Encounter (HOSPITAL_COMMUNITY): Payer: Self-pay | Admitting: Family Medicine

## 2016-01-16 ENCOUNTER — Emergency Department (HOSPITAL_COMMUNITY): Payer: Self-pay

## 2016-01-16 DIAGNOSIS — R0789 Other chest pain: Secondary | ICD-10-CM | POA: Insufficient documentation

## 2016-01-16 DIAGNOSIS — Z720 Tobacco use: Secondary | ICD-10-CM

## 2016-01-16 DIAGNOSIS — I10 Essential (primary) hypertension: Secondary | ICD-10-CM | POA: Insufficient documentation

## 2016-01-16 DIAGNOSIS — F17200 Nicotine dependence, unspecified, uncomplicated: Secondary | ICD-10-CM | POA: Insufficient documentation

## 2016-01-16 LAB — CBC
HCT: 46.9 % (ref 39.0–52.0)
HEMOGLOBIN: 15.8 g/dL (ref 13.0–17.0)
MCH: 28.2 pg (ref 26.0–34.0)
MCHC: 33.7 g/dL (ref 30.0–36.0)
MCV: 83.6 fL (ref 78.0–100.0)
Platelets: 229 10*3/uL (ref 150–400)
RBC: 5.61 MIL/uL (ref 4.22–5.81)
RDW: 12.3 % (ref 11.5–15.5)
WBC: 9.3 10*3/uL (ref 4.0–10.5)

## 2016-01-16 LAB — URINALYSIS, ROUTINE W REFLEX MICROSCOPIC
Bilirubin Urine: NEGATIVE
GLUCOSE, UA: NEGATIVE mg/dL
Hgb urine dipstick: NEGATIVE
KETONES UR: NEGATIVE mg/dL
LEUKOCYTES UA: NEGATIVE
Nitrite: NEGATIVE
PROTEIN: NEGATIVE mg/dL
Specific Gravity, Urine: 1.023 (ref 1.005–1.030)
pH: 6 (ref 5.0–8.0)

## 2016-01-16 LAB — RAPID URINE DRUG SCREEN, HOSP PERFORMED
AMPHETAMINES: NOT DETECTED
Barbiturates: NOT DETECTED
Benzodiazepines: NOT DETECTED
Cocaine: NOT DETECTED
OPIATES: NOT DETECTED
Tetrahydrocannabinol: NOT DETECTED

## 2016-01-16 LAB — BASIC METABOLIC PANEL
Anion gap: 7 (ref 5–15)
BUN: 11 mg/dL (ref 6–20)
CHLORIDE: 109 mmol/L (ref 101–111)
CO2: 22 mmol/L (ref 22–32)
Calcium: 9.1 mg/dL (ref 8.9–10.3)
Creatinine, Ser: 1.01 mg/dL (ref 0.61–1.24)
GFR calc Af Amer: >60 mL/min (ref >60)
GLUCOSE: 107 mg/dL — AB (ref 65–99)
Potassium: 4 mmol/L (ref 3.5–5.1)
Sodium: 138 mmol/L (ref 135–145)

## 2016-01-16 LAB — I-STAT TROPONIN, ED: Troponin i, poc: 0 ng/mL (ref 0.00–0.08)

## 2016-01-16 MED ORDER — METOPROLOL TARTRATE 5 MG/5ML IV SOLN
5.0000 mg | Freq: Once | INTRAVENOUS | Status: AC
  Administered 2016-01-16: 5 mg via INTRAVENOUS
  Filled 2016-01-16: qty 5

## 2016-01-16 MED ORDER — METOPROLOL TARTRATE 25 MG PO TABS: 25.0000 mg | ORAL_TABLET | Freq: Every day | ORAL | Status: DC

## 2016-01-16 NOTE — ED Notes (Addendum)
Patient here with 1 week of left arm pain and today noted that his BP was high, no history of hypertension. Complains of some shortness of breath with same and weakness. NAD. denies CP. Patient took 2 of his fathers BP meds this am, doesn't know which BP med it was

## 2016-01-16 NOTE — ED Provider Notes (Signed)
CSN: [id number]     Arrival date & time 01/16/16  1219 History   First MD Initiated Contact with Patient 01/16/16 1350     Chief Complaint  Patient presents with  . left arm pain   . Hypertension  Pt is a 30 yo wm with a hx of HTN who comes in today with elevated bp.  The pt said that he was taken off his bp meds a few years ago.  Pt said that his bp has been elevated for the past few weeks.  He took 1 of his dad's amiodarone and enalipril.  Pt does not know what doses.  Pt also said that he's had CP and left arm for the past week.  Pt wants to know what his coronary arteries look like.  (Consider location/radiation/quality/duration/timing/severity/associated sxs/prior Treatment) The history is provided by the patient.    Past Medical History  Diagnosis Date  . Elevated blood pressure reading without diagnosis of hypertension   . Anxiety   . Tinea versicolor   . Smoker   . Morbid obesity [facility](HCC)    Past Surgical History  Procedure Laterality Date  . Bilat inguinal hernia    . Tonsillectomy     Family History  Problem Relation Age of Onset  . Heart attack Father    Social History  Substance Use Topics  . Smoking status: Current Every Day Smoker -- 1.00 packs/day  . Smokeless tobacco: None  . Alcohol Use: 0.0 oz/week    0 Standard drinks or equivalent per week     Comment: once a week    Review of Systems  Cardiovascular: Positive for chest pain.  All other systems reviewed and are negative.     Allergies  Review of patient's allergies indicates no known allergies.  Home Medications   Prior to Admission medications   Medication Sig Start Date End Date Taking? Authorizing Provider  ibuprofen (ADVIL,MOTRIN) 200 MG tablet Take 800 mg by mouth every 6 (six) hours as needed (pain).   Yes Historical Provider, MD  amoxicillin-clavulanate (AUGMENTIN) 875-125 MG per tablet Take 1 tablet by mouth 2 (two) times daily. One po bid x 7 days Patient not taking: Reported on  01/16/2016 12/09/14   Paula LibraJohn Molpus, MD  metoprolol (LOPRESSOR) 25 MG tablet Take 1 tablet (25 mg total) by mouth daily. 01/16/16   Jacalyn LefevreJulie Anshi Jalloh, MD  Naltrexone-Bupropion HCl ER 8-90 MG TB12 2 tab by mouth twice per day starting Month #2 Patient not taking: Reported on 01/16/2016 10/26/14   Corwin LevinsJames W John, MD   BP 132/92 mmHg  Pulse 74  Temp(Src) 98.1 F (36.7 C) (Oral)  Resp 22  Ht 6\' 5"  (1.956 m)  Wt 290 lb (131.543 kg)  BMI 34.38 kg/m2  SpO2 95% Physical Exam  Constitutional: He is oriented to person, place, and time. He appears well-developed and well-nourished.  HENT:  Head: Normocephalic and atraumatic.  Right Ear: External ear normal.  Left Ear: External ear normal.  Nose: Nose normal.  Mouth/Throat: Oropharynx is clear and moist.  Eyes: Conjunctivae and EOM are normal. Pupils are equal, round, and reactive to light.  Neck: Normal range of motion. Neck supple.  Cardiovascular: Normal rate, regular rhythm and normal heart sounds.   Pulmonary/Chest: Effort normal and breath sounds normal.  Abdominal: Soft. Bowel sounds are normal.  Musculoskeletal: Normal range of motion.  Neurological: He is alert and oriented to person, place, and time.  Skin: Skin is warm and dry.  Psychiatric: He has a normal mood  and affect. His behavior is normal. Judgment and thought content normal.  Nursing note and vitals reviewed.   ED Course  Procedures (including critical care time) Labs Review Labs Reviewed  BASIC METABOLIC PANEL - Abnormal; Notable for the following:    Glucose, Bld 107 (*)    All other components within normal limits  CBC  URINALYSIS, ROUTINE W REFLEX MICROSCOPIC (NOT AT [facility])  URINE RAPID DRUG SCREEN, HOSP PERFORMED  I-STAT TROPOININ, ED    Imaging Review Dg Chest 2 View  01/16/2016  CLINICAL DATA:  Chest pain with shortness breath today. History of hypertension and smoking. EXAM: CHEST  2 VIEW COMPARISON:  05/04/2012 and 08/28/2009. FINDINGS: The heart size and  mediastinal contours are normal. The lungs are clear. There is no pleural effusion or pneumothorax. No acute osseous findings are identified. IMPRESSION: Stable chest.  No active cardiopulmonary process . Electronically Signed   By: Carey Bullocks M.D.   On: 01/16/2016 12:56   I have personally reviewed and evaluated these images and lab results as part of my medical decision-making.   EKG Interpretation   Date/Time:  Wednesday January 16 2016 12:26:29 EDT Ventricular Rate:  91 PR Interval:  166 QRS Duration: 104 QT Interval:  346 QTC Calculation: 425 R Axis:   93 Text Interpretation:  Normal sinus rhythm Rightward axis Borderline ECG  Confirmed by Aja Bolander MD, Manasvini Whatley ([id number]) on 01/16/2016 2:03:10 PM      MDM  I spoke with pt about trying to stop smoking and living a healthier life style.  He is encouraged to eat better and exercise.  He is given the number to cardiology.  He knows to return if worse.  Final diagnoses:  Essential hypertension  Tobacco abuse  Atypical chest pain      Jacalyn Lefevre, MD 01/16/16 1538

## 2016-01-16 NOTE — Discharge Instructions (Signed)
Hypertension Hypertension, commonly called high blood pressure, is when the force of blood pumping through your arteries is too strong. Your arteries are the blood vessels that carry blood from your heart throughout your body. A blood pressure reading consists of a higher number over a lower number, such as 110/72. The higher number (systolic) is the pressure inside your arteries when your heart pumps. The lower number (diastolic) is the pressure inside your arteries when your heart relaxes. Ideally you want your blood pressure below 120/80. Hypertension forces your heart to work harder to pump blood. Your arteries may become narrow or stiff. Having untreated or uncontrolled hypertension can cause heart attack, stroke, kidney disease, and other problems. RISK FACTORS Some risk factors for high blood pressure are controllable. Others are not.  Risk factors you cannot control include:   Race. You may be at higher risk if you are African American.  Age. Risk increases with age.  Gender. Men are at higher risk than women before age 45 years. After age 65, women are at higher risk than men. Risk factors you can control include:  Not getting enough exercise or physical activity.  Being overweight.  Getting too much fat, sugar, calories, or salt in your diet.  Drinking too much alcohol. SIGNS AND SYMPTOMS Hypertension does not usually cause signs or symptoms. Extremely high blood pressure (hypertensive crisis) may cause headache, anxiety, shortness of breath, and nosebleed. DIAGNOSIS To check if you have hypertension, your health care provider will measure your blood pressure while you are seated, with your arm held at the level of your heart. It should be measured at least twice using the same arm. Certain conditions can cause a difference in blood pressure between your right and left arms. A blood pressure reading that is higher than normal on one occasion does not mean that you need treatment. If  it is not clear whether you have high blood pressure, you may be asked to return on a different day to have your blood pressure checked again. Or, you may be asked to monitor your blood pressure at home for 1 or more weeks. TREATMENT Treating high blood pressure includes making lifestyle changes and possibly taking medicine. Living a healthy lifestyle can help lower high blood pressure. You may need to change some of your habits. Lifestyle changes may include:  Following the DASH diet. This diet is high in fruits, vegetables, and whole grains. It is low in salt, red meat, and added sugars.  Keep your sodium intake below 2,300 mg per day.  Getting at least 30-45 minutes of aerobic exercise at least 4 times per week.  Losing weight if necessary.  Not smoking.  Limiting alcoholic beverages.  Learning ways to reduce stress. Your health care provider may prescribe medicine if lifestyle changes are not enough to get your blood pressure under control, and if one of the following is true:  You are 18-59 years of age and your systolic blood pressure is above 140.  You are 60 years of age or older, and your systolic blood pressure is above 150.  Your diastolic blood pressure is above 90.  You have diabetes, and your systolic blood pressure is over 140 or your diastolic blood pressure is over 90.  You have kidney disease and your blood pressure is above 140/90.  You have heart disease and your blood pressure is above 140/90. Your personal target blood pressure may vary depending on your medical conditions, your age, and other factors. HOME CARE INSTRUCTIONS    Have your blood pressure rechecked as directed by your health care provider.   Take medicines only as directed by your health care provider. Follow the directions carefully. Blood pressure medicines must be taken as prescribed. The medicine does not work as well when you skip doses. Skipping doses also puts you at risk for  problems.  Do not smoke.   Monitor your blood pressure at home as directed by your health care provider. SEEK MEDICAL CARE IF:   You think you are having a reaction to medicines taken.  You have recurrent headaches or feel dizzy.  You have swelling in your ankles.  You have trouble with your vision. SEEK IMMEDIATE MEDICAL CARE IF:  You develop a severe headache or confusion.  You have unusual weakness, numbness, or feel faint.  You have severe chest or abdominal pain.  You vomit repeatedly.  You have trouble breathing. MAKE SURE YOU:   Understand these instructions.  Will watch your condition.  Will get help right away if you are not doing well or get worse.   This information is not intended to replace advice given to you by your health care provider. Make sure you discuss any questions you have with your health care provider.   Document Released: 06/16/2005 Document Revised: 10/31/2014 Document Reviewed: 04/08/2013 Elsevier Interactive Patient Education 2016 [employer]  

## 2016-02-14 ENCOUNTER — Ambulatory Visit (INDEPENDENT_AMBULATORY_CARE_PROVIDER_SITE_OTHER): Payer: Self-pay | Admitting: Internal Medicine

## 2016-02-14 ENCOUNTER — Encounter: Payer: Self-pay | Admitting: Internal Medicine

## 2016-02-14 VITALS — BP 122/80 | HR 75 | Temp 97.9°F | Resp 20 | Wt 290.0 lb

## 2016-02-14 DIAGNOSIS — I10 Essential (primary) hypertension: Secondary | ICD-10-CM

## 2016-02-14 DIAGNOSIS — F411 Generalized anxiety disorder: Secondary | ICD-10-CM

## 2016-02-14 MED ORDER — METOPROLOL SUCCINATE ER 25 MG PO TB24: 25.0000 mg | ORAL_TABLET | Freq: Every day | ORAL | 3 refills | Status: AC

## 2016-02-14 MED ORDER — CLONAZEPAM 0.5 MG PO TABS: ORAL_TABLET | ORAL | 1 refills | Status: AC

## 2016-02-14 NOTE — Progress Notes (Signed)
   Subjective:    Patient ID: Christopher Le, male    DOB: 10/02/1985, 30 y.o.   MRN: [id number]  HPI  Here to f/u; overall doing ok,  Pt denies chest pain, increasing sob or doe, wheezing, orthopnea, PND, increased LE swelling, palpitations, dizziness or syncope.  Pt denies new neurological symptoms such as new headache, or facial or extremity weakness or numbness.  Pt denies polydipsia, polyuria, or low sugar episode.   Pt denies new neurological symptoms such as new headache, or facial or extremity weakness or numbness.   Pt states overall good compliance with meds, mostly trying to follow appropriate diet, with wt overall stable,  but little exercise however. Denies worsening depressive symptoms, suicidal ideation, or panic; has ongoing anxiety BP Readings from Last 3 Encounters:  02/14/16 122/80  01/16/16 128/91  12/09/14 141/85   Past Medical History:  Diagnosis Date  . Anxiety   . Elevated blood pressure reading without diagnosis of hypertension   . Morbid obesity (HCC)   . Smoker   . Tinea versicolor    Past Surgical History:  Procedure Laterality Date  . bilat inguinal hernia    . TONSILLECTOMY      reports that he has been smoking.  He has been smoking about 1.00 pack per day. He does not have any smokeless tobacco history on file. He reports that he drinks alcohol. He reports that he does not use drugs. family history includes Heart attack in his father. No Known Allergies Current Outpatient Prescriptions on File Prior to Visit  Medication Sig Dispense Refill  . ibuprofen (ADVIL,MOTRIN) 200 MG tablet Take 800 mg by mouth every 6 (six) hours as needed (pain).    . Naltrexone-Bupropion HCl ER 8-90 MG TB12 2 tab by mouth twice per day starting Month #2 60 tablet 5   No current facility-administered medications on file prior to visit.    Review of Systems All otherwise neg per pt     Objective:   Physical Exam BP 122/80   Pulse 75   Temp 97.9 F (36.6 C) (Oral)   Resp  20   Wt 290 lb (131.5 kg)   SpO2 92%   BMI 34.39 kg/m  VS noted,  Constitutional: Pt appears in no apparent distress HENT: Head: NCAT.  Right Ear: External ear normal.  Left Ear: External ear normal.  Eyes: . Pupils are equal, round, and reactive to light. Conjunctivae and EOM are normal Neck: Normal range of motion. Neck supple.  Cardiovascular: Normal rate and regular rhythm.   Pulmonary/Chest: Effort normal and breath sounds without rales or wheezing.  Abd:  Soft, NT, ND, + BS Neurological: Pt is alert. Not confused , motor grossly intact Skin: Skin is warm. No rash, no LE edema Psychiatric: Pt behavior is normal. No agitation. mild nervous    Assessment & Plan:

## 2016-02-14 NOTE — Patient Instructions (Signed)
OK to change the lopressor to Toprol XL 25 mg per day  Please take all new medication as prescribed - the klonopin only for panic  Let us know if you would want to try Celexa 20 mg for nerves (I believe this is still $4 at [facility])  Please continue all other medications as before, and refills have been done if requested.  Please have the pharmacy call with any other refills you may need.  Please keep your appointments with your specialists as you may have planned  Please return in 1 year for your yearly visit, or sooner if needed

## 2016-02-14 NOTE — Progress Notes (Signed)
Pre visit review using our clinic review tool, if applicable. No additional management support is needed unless otherwise documented below in the visit note. 

## 2016-02-15 ENCOUNTER — Ambulatory Visit: Payer: Self-pay | Admitting: Internal Medicine

## 2016-02-16 NOTE — Assessment & Plan Note (Signed)
stable overall by history and exam, recent data reviewed with pt, and pt to continue medical treatment as before,  to f/u any worsening symptoms or concerns BP Readings from Last 3 Encounters:  02/14/16 122/80  01/16/16 128/91  12/09/14 141/85

## 2016-02-16 NOTE — Assessment & Plan Note (Signed)
Mild to mod, for klonopin bid prn panic,  to f/u any worsening symptoms or concerns

## 2016-08-28 ENCOUNTER — Other Ambulatory Visit: Payer: Self-pay | Admitting: Internal Medicine

## 2017-03-10 ENCOUNTER — Other Ambulatory Visit: Payer: Self-pay | Admitting: Internal Medicine

## 2018-03-24 IMAGING — CR DG CHEST 2V
2 series · 2 of 2 positions shown · non-contrast
Comparison: 05/04/2012 and 08/28/2009.

CLINICAL DATA: Chest pain with shortness breath today. History of
hypertension and smoking.

EXAM:
CHEST  2 VIEW

[chest pa]
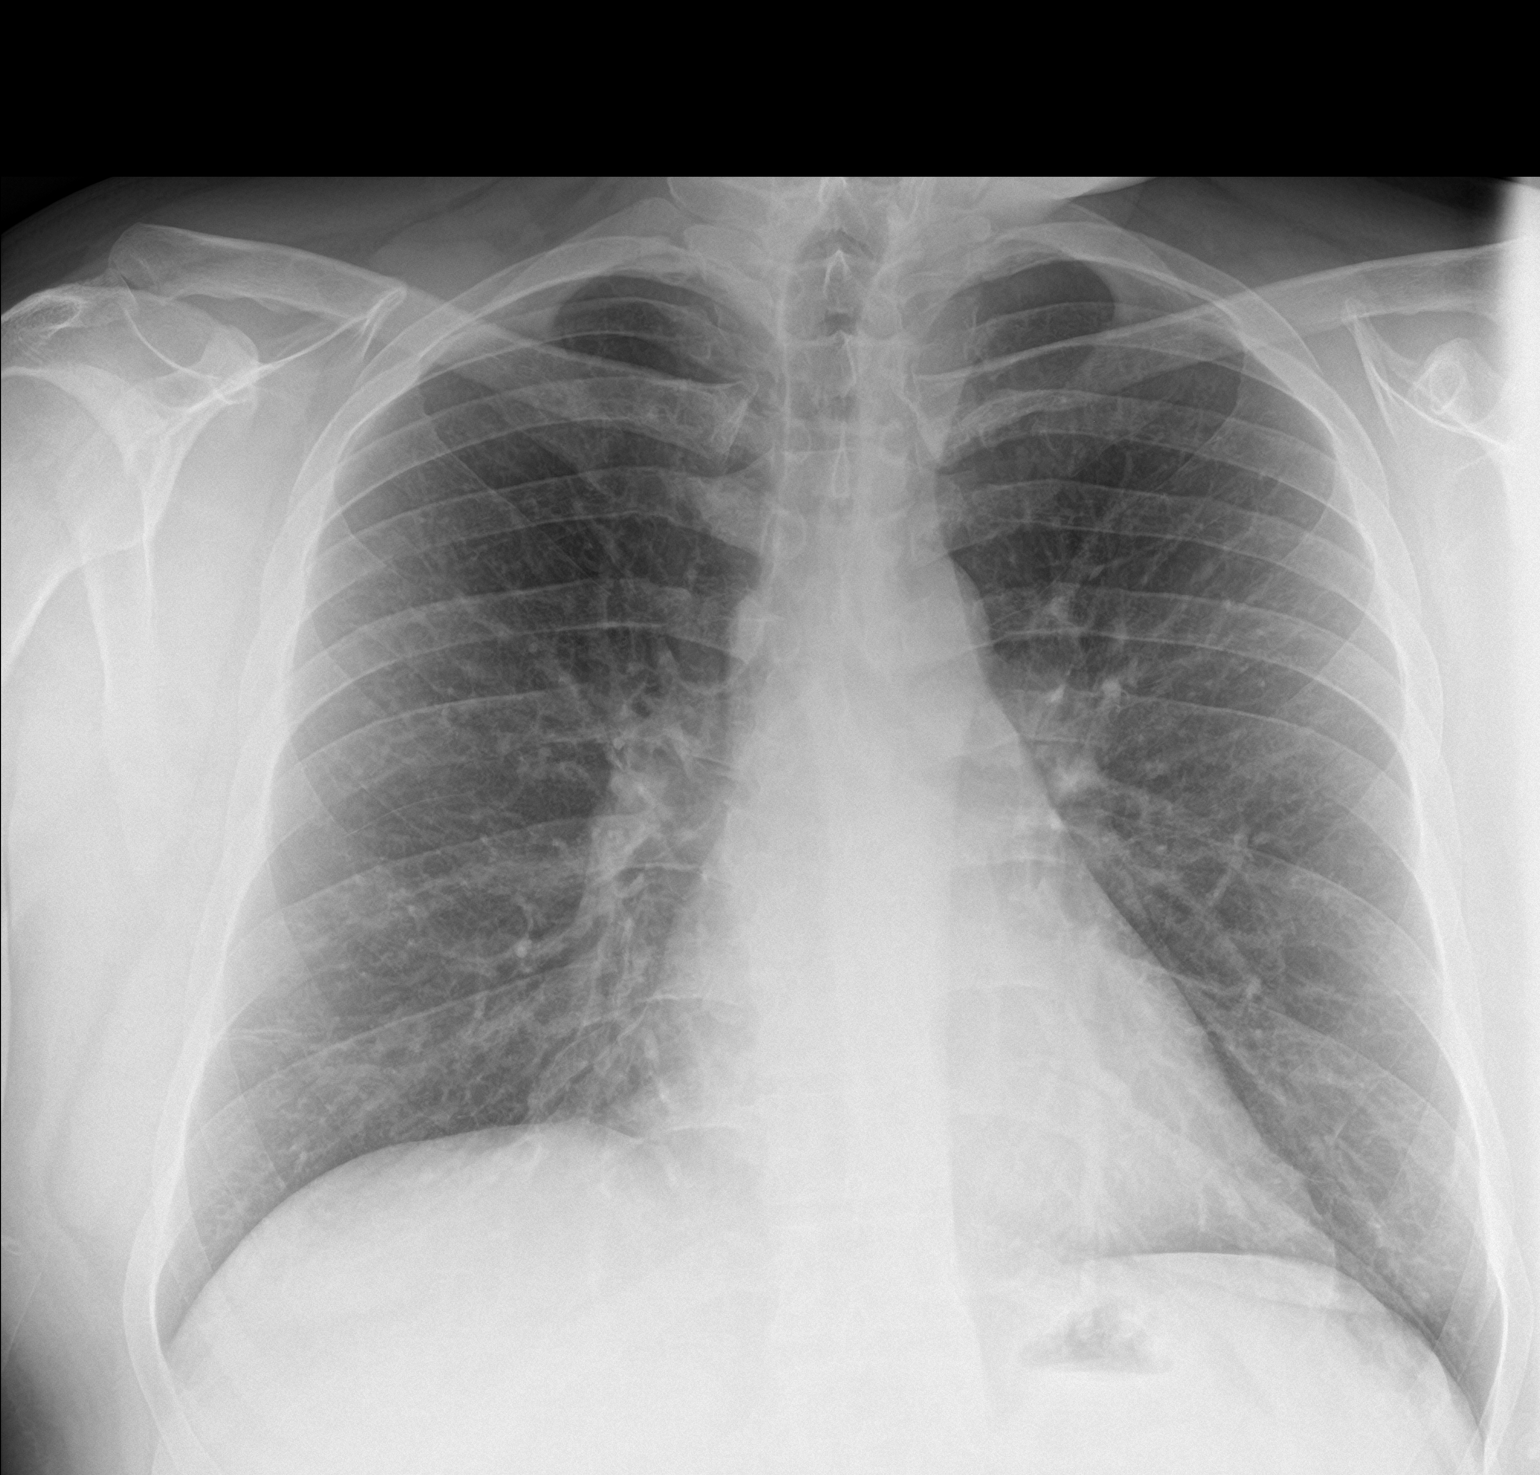

[chest lat]
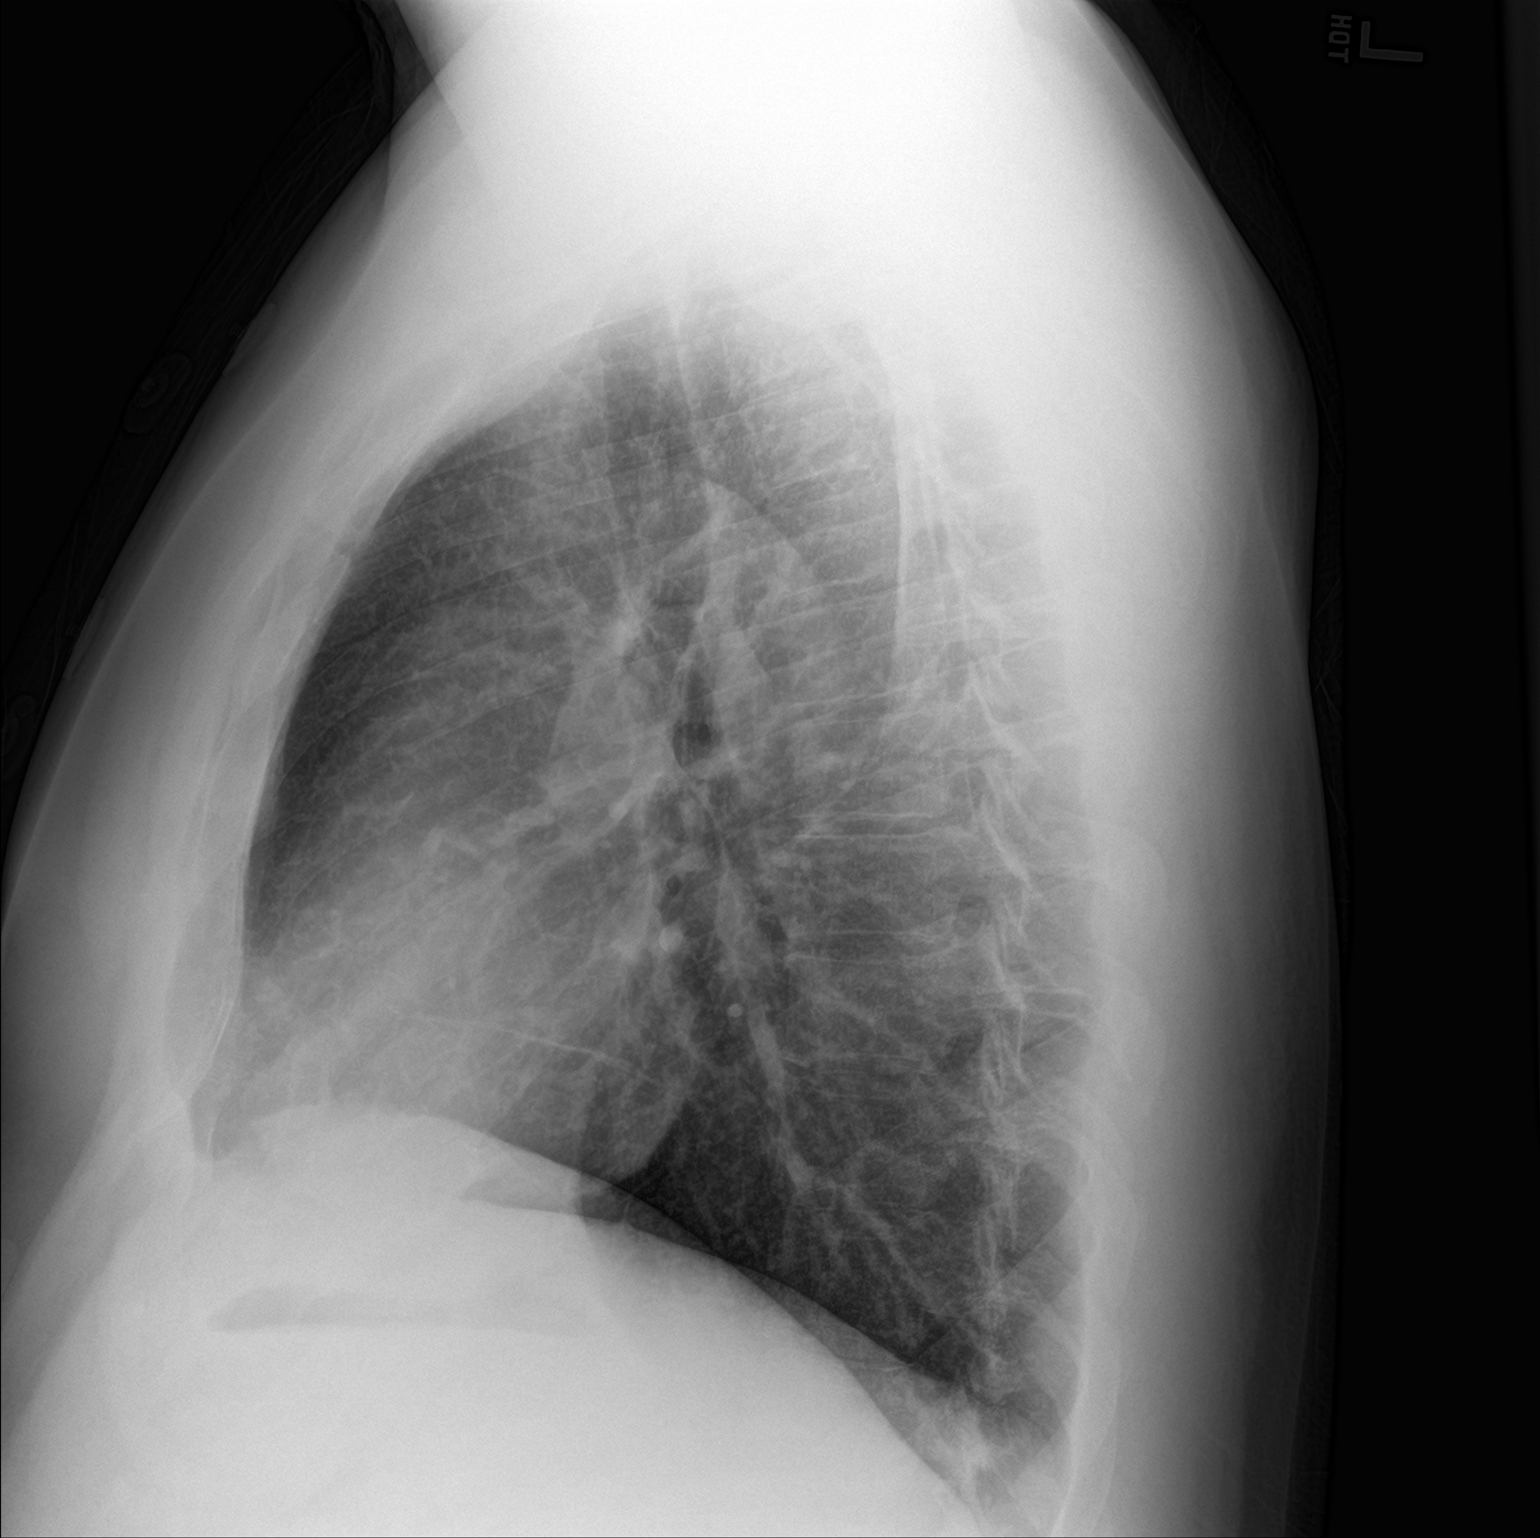

[2 of 2 positions shown; findings below may reference images not displayed]

FINDINGS: The heart size and mediastinal contours are normal. The lungs are
clear. There is no pleural effusion or pneumothorax. No acute
osseous findings are identified.
IMPRESSION: Stable chest.  No active cardiopulmonary process .
